# Patient Record
Sex: Female | Born: 1995 | Hispanic: No | Marital: Single | Smoking: Never smoker
Health system: Southern US, Community
[De-identification: ages and names within clinical notes are randomized; demographics above are authoritative.]

---

## 2018-10-02 ENCOUNTER — Ambulatory Visit (INDEPENDENT_AMBULATORY_CARE_PROVIDER_SITE_OTHER): Payer: Self-pay | Admitting: Sports Medicine

## 2018-10-02 VITALS — BP 112/74 | Ht 65.0 in | Wt 130.0 lb

## 2018-10-02 DIAGNOSIS — S86899A Other injury of other muscle(s) and tendon(s) at lower leg level, unspecified leg, initial encounter: Secondary | ICD-10-CM

## 2018-10-02 NOTE — Patient Instructions (Signed)
  Good to see you again today.  We have placed some arch support in her running shoes but I do not want you to run for the next 2 weeks.  You may try some light jogging afterwards if pain-free but if you still have pain with running then you will have to cross train (bike or swim) until I see you again here in the office in 4 weeks.  Once you are pain-free, you will need to practice some line drills at the local track to help correct your running form.  I would encourage you to continue with your exercises and ice 2-3 times a day.  I do not feel like we need any imaging but if you are still having pain in 4 weeks I may want to get an x-ray at that time.  Feel free to call with any questions.

## 2018-10-02 NOTE — Progress Notes (Signed)
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  She states the pain started around 2 weeks ago.  She states she started exercising with the [employer] team around 1 month ago.  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  She states she took years off, and recently returned activity.  She states when she returned to run for [employer], she started that to 400 with high intensity training.  She states the pain started around 2 weeks after starting his training.  She was seen in the training room for a physical, and was given heel walks and toe walks.  She states she is noticed some improvement since starting these.  She also switched to a [employer] shoe, which has helped somewhat with the pain.  She is still having almost instant pain in her shins but she does any jogging or running.  She denies any numbness and tingling in her foot.  She denies any trauma to the area.  She denies any history of stress fractures.  She denies any [place] inciting event.  Past Injuries: None Past Surgeries: None Smoking: Denies Family Hx: Noncontributory  ROS: Per HPI; in addition no fever, no rash, no additional weakness, no additional numbness, no additional paresthesias, and no additional falls/injury.   All past medical history is, medication, and allergies reviewed myself at today's visit.  Objective: BP 112/74   Ht 5\' 5"  (1.651 m)   Wt 130 lb (59 kg)   BMI 21.63 kg/m  Gen: Right-Hand Dominant. NAD, well groomed, a/o x3, normal affect.  CV: Well-perfused. Warm.  Resp: Non-labored.  Neuro: Sensation intact throughout. No gross coordination deficits.  Gait: Nonpathologic posture, stride remarkable for genu valgus, pronation of both feet, and out toeing on the right foot.  Bilateral foot exam: No erythema, warmth or swelling noted.  No tenderness palpation on exam.  Normal transverse and longitudinal  arches.  Full range of motion dorsiflexion, plantarflexion, eversion, inversion.  Strength out of 5 throughout testing.  Negative anterior drawer bilaterally.  Bilateral leg exam: No erythema, warmth, swelling noted.  Tenderness palpation over the anterior tibia on bilateral legs.  Full range of motion dorsiflexion, plantarflexion, eversion, inversion.  Some pain with eversion of the foot.  Strength out of 5 throughout testing.  Negative hop test.  Assessment and Plan: 1. Shinsplints, likely secondary to sudden onset of high intensity training. 2.  Pronation of gait.  We discussed treatment options today.  I believe she will benefit greatly from sitting out 2 weeks from running.  This includes any running or jumping.  She can exercise otherwise if she gets no pain.  We gave her some green insoles today with scaphoid pads to help with the pronation of her foot.  Also advised to start doing line drills to work on the out toeing on her right foot.  She should continue with the heel walks and toe walks for the shinsplints.  We will see her back for follow-up in 1 month.  If she continues to have pain that persist, I would consider getting x-rays to further evaluate.  Alric Quan, MD [facility] Fellow 10/02/2018 5:19 PM   Patient seen and evaluated with the fellow.  I agree with the above plan of care.  Treatment as above and follow-up with me in 4 weeks.  If she continues to have symptoms at follow-up, consider merits of  x-ray specifically to rule out stress fracture.

## 2018-10-03 ENCOUNTER — Encounter: Payer: Self-pay | Admitting: Sports Medicine

## 2020-02-03 ENCOUNTER — Emergency Department (HOSPITAL_COMMUNITY)
Admission: EM | Admit: 2020-02-03 | Discharge: 2020-02-03 | Disposition: A | Discharge status: Home / Self Care | Payer: BLUE CROSS/BLUE SHIELD | Attending: Emergency Medicine | Admitting: Emergency Medicine

## 2020-02-03 ENCOUNTER — Encounter (HOSPITAL_COMMUNITY): Payer: Self-pay | Admitting: *Deleted

## 2020-02-03 ENCOUNTER — Other Ambulatory Visit: Payer: Self-pay

## 2020-02-03 ENCOUNTER — Emergency Department (HOSPITAL_COMMUNITY): Payer: BLUE CROSS/BLUE SHIELD

## 2020-02-03 DIAGNOSIS — R0602 Shortness of breath: Secondary | ICD-10-CM | POA: Insufficient documentation

## 2020-02-03 DIAGNOSIS — Z5321 Procedure and treatment not carried out due to patient leaving prior to being seen by health care provider: Secondary | ICD-10-CM | POA: Insufficient documentation

## 2020-02-03 DIAGNOSIS — R0789 Other chest pain: Secondary | ICD-10-CM | POA: Insufficient documentation

## 2020-02-03 NOTE — ED Triage Notes (Signed)
The pt is c/o chest pain  Central chest and pain in her lt shoulder and sob  Not sob at present lmp  June 17th

## 2020-02-03 NOTE — ED Notes (Signed)
Pt called for vital signs x1 with no response.

## 2020-02-03 NOTE — ED Notes (Signed)
Called for pt x3, no response. 

## 2020-02-26 ENCOUNTER — Other Ambulatory Visit: Payer: Self-pay

## 2020-02-26 ENCOUNTER — Encounter (HOSPITAL_COMMUNITY): Payer: Self-pay

## 2020-02-26 DIAGNOSIS — R079 Chest pain, unspecified: Secondary | ICD-10-CM | POA: Insufficient documentation

## 2020-02-26 DIAGNOSIS — R519 Headache, unspecified: Secondary | ICD-10-CM | POA: Insufficient documentation

## 2020-02-26 DIAGNOSIS — Z5321 Procedure and treatment not carried out due to patient leaving prior to being seen by health care provider: Secondary | ICD-10-CM | POA: Diagnosis not present

## 2020-02-26 NOTE — ED Triage Notes (Signed)
Pt complains of chest pain and a headache for a week, has been seen at [facility] for the same

## 2020-02-27 ENCOUNTER — Emergency Department (HOSPITAL_COMMUNITY)
Admission: EM | Admit: 2020-02-27 | Discharge: 2020-02-27 | Disposition: A | Discharge status: Home / Self Care | Payer: BLUE CROSS/BLUE SHIELD | Attending: Emergency Medicine | Admitting: Emergency Medicine

## 2020-02-27 NOTE — ED Notes (Signed)
Pt called for a room with no answer and not seen in the lobby

## 2020-02-27 NOTE — ED Notes (Signed)
Called pt from lobby x1 No answer 

## 2020-06-04 ENCOUNTER — Other Ambulatory Visit: Payer: Self-pay

## 2020-06-04 ENCOUNTER — Encounter (HOSPITAL_COMMUNITY): Payer: Self-pay | Admitting: Emergency Medicine

## 2020-06-04 ENCOUNTER — Emergency Department (HOSPITAL_COMMUNITY)
Admission: EM | Admit: 2020-06-04 | Discharge: 2020-06-04 | Disposition: A | Discharge status: Home / Self Care | Payer: BLUE CROSS/BLUE SHIELD | Attending: Emergency Medicine | Admitting: Emergency Medicine

## 2020-06-04 ENCOUNTER — Emergency Department (HOSPITAL_COMMUNITY): Payer: BLUE CROSS/BLUE SHIELD

## 2020-06-04 DIAGNOSIS — R197 Diarrhea, unspecified: Secondary | ICD-10-CM | POA: Diagnosis not present

## 2020-06-04 DIAGNOSIS — R0602 Shortness of breath: Secondary | ICD-10-CM | POA: Diagnosis not present

## 2020-06-04 DIAGNOSIS — N898 Other specified noninflammatory disorders of vagina: Secondary | ICD-10-CM | POA: Insufficient documentation

## 2020-06-04 DIAGNOSIS — R0789 Other chest pain: Secondary | ICD-10-CM

## 2020-06-04 DIAGNOSIS — R072 Precordial pain: Secondary | ICD-10-CM | POA: Insufficient documentation

## 2020-06-04 DIAGNOSIS — R1030 Lower abdominal pain, unspecified: Secondary | ICD-10-CM | POA: Diagnosis present

## 2020-06-04 LAB — CBC
HCT: 39.5 % (ref 36.0–46.0)
Hemoglobin: 13.1 g/dL (ref 12.0–15.0)
MCH: 29.8 pg (ref 26.0–34.0)
MCHC: 33.2 g/dL (ref 30.0–36.0)
MCV: 89.8 fL (ref 80.0–100.0)
Platelets: 235 10*3/uL (ref 150–400)
RBC: 4.4 MIL/uL (ref 3.87–5.11)
RDW: 12.9 % (ref 11.5–15.5)
WBC: 4.4 10*3/uL (ref 4.0–10.5)
nRBC: 0 % (ref 0.0–0.2)

## 2020-06-04 LAB — COMPREHENSIVE METABOLIC PANEL
ALT: 16 U/L (ref 0–44)
AST: 17 U/L (ref 15–41)
Albumin: 4.8 g/dL (ref 3.5–5.0)
Alkaline Phosphatase: 49 U/L (ref 38–126)
Anion gap: 8 (ref 5–15)
BUN: 16 mg/dL (ref 6–20)
CO2: 27 mmol/L (ref 22–32)
Calcium: 9.9 mg/dL (ref 8.9–10.3)
Chloride: 100 mmol/L (ref 98–111)
Creatinine, Ser: 0.86 mg/dL (ref 0.44–1.00)
GFR, Estimated: >60 mL/min (ref >60)
Glucose, Bld: 98 mg/dL (ref 70–99)
Potassium: 3.9 mmol/L (ref 3.5–5.1)
Sodium: 135 mmol/L (ref 135–145)
Total Bilirubin: 1 mg/dL (ref 0.3–1.2)
Total Protein: 8.9 g/dL — ABNORMAL HIGH (ref 6.5–8.1)

## 2020-06-04 LAB — URINALYSIS, ROUTINE W REFLEX MICROSCOPIC
Bilirubin Urine: NEGATIVE
Glucose, UA: NEGATIVE mg/dL
Hgb urine dipstick: NEGATIVE
Ketones, ur: 20 mg/dL — AB
Nitrite: NEGATIVE
Protein, ur: NEGATIVE mg/dL
Specific Gravity, Urine: 1.023 (ref 1.005–1.030)
pH: 7 (ref 5.0–8.0)

## 2020-06-04 LAB — LIPASE, BLOOD: Lipase: 25 U/L (ref 11–51)

## 2020-06-04 LAB — I-STAT BETA HCG BLOOD, ED (MC, WL, AP ONLY): I-stat hCG, quantitative: <5 m[IU]/mL (ref <5)

## 2020-06-04 NOTE — Discharge Instructions (Signed)
Please read and follow all provided instructions.  Your diagnoses today include:  1. Sternal pain   2. Lower abdominal pain     Tests performed today include:  Blood cell counts and platelets  Kidney and liver function tests  Pancreas function test (called lipase)  Urine test to look for infection -some infection fighting cells but unclear if there is infection in your urine  A blood or urine test for pregnancy (women only)  Chest x-ray -no signs of problems  Vital signs. See below for your results today.   Medications prescribed:  Please use over-the-counter NSAID medications (ibuprofen, naproxen) as directed on the packaging for pain.   Take any prescribed medications only as directed.  Home care instructions:   Follow any educational materials contained in this packet.  Follow-up instructions: Please follow-up with the GYN referral for further evaluation of your symptoms.    Return instructions:  SEEK IMMEDIATE MEDICAL ATTENTION IF:  The pain does not go away or becomes severe   A temperature above 101F develops   Repeated vomiting occurs (multiple episodes)   The pain becomes localized to portions of the abdomen. The right side could possibly be appendicitis. In an adult, the left lower portion of the abdomen could be colitis or diverticulitis.   Blood is being passed in stools or vomit (bright red or black tarry stools)   You develop chest pain, difficulty breathing, dizziness or fainting, or become confused, poorly responsive, or inconsolable (young children)  If you have any other emergent concerns regarding your health  Additional Information: Abdominal (belly) pain can be caused by many things. Your caregiver performed an examination and possibly ordered blood/urine tests and imaging (CT scan, x-rays, ultrasound). Many cases can be observed and treated at home after initial evaluation in the emergency department. Even though you are being discharged home,  abdominal pain can be unpredictable. Therefore, you need a repeated exam if your pain does not resolve, returns, or worsens. Most patients with abdominal pain don't have to be admitted to the hospital or have surgery, but serious problems like appendicitis and gallbladder attacks can start out as nonspecific pain. Many abdominal conditions cannot be diagnosed in one visit, so follow-up evaluations are very important.  Your vital signs today were: BP 111/83   Pulse 87   Temp 98.7 F (37.1 C) (Oral)   Resp 18   LMP 05/20/2020   SpO2 99%  If your blood pressure (bp) was elevated above 135/85 this visit, please have this repeated by your doctor within one month. --------------

## 2020-06-04 NOTE — ED Triage Notes (Signed)
Pt reports had lower abd pains that radiates down both legs since the weekend and thought was constipated so took a laxative and had loose stools since. Pt also c/o central chest tightness and SOB when getting up in the mornings since Sunday.

## 2020-06-04 NOTE — ED Provider Notes (Signed)
[facility]-EMERGENCY DEPT Provider Note   CSN: [id number] Arrival date & time: 06/04/20  0762     History Chief Complaint  Patient presents with  . Abdominal Pain  . Chest Pain    Veronica English is a 24 y.o. female.  Patient presents to the emergency department with several complaints.  Patient states that 5 days ago she began having lower abdominal cramping and pain that radiated into her legs.  She was seen at urgent care and was prescribed a laxative for presumptive constipation.  She reports having some loose stools since that time without blood.  The following day she also developed an aching pain in her central chest with occasional shortness of breath that is worse in the mornings.  No associated fevers or cough.  No URI symptoms.  Patient reports being provided prescription for metronidazole gel for treatment of BV as she currently has vaginal discharge.  She is sexually active, not concerned for sexually transmitted infection.  No history of abdominal surgeries. Patient denies risk factors for pulmonary embolism including: unilateral leg swelling, history of DVT/PE/other blood clots, use of exogenous hormones, recent immobilizations, recent surgery, recent travel (>4hr segment), malignancy, hemoptysis. No hematuria or irritative UTI symptoms including dysuria, increased frequency or urgency.         History reviewed. No pertinent past medical history.  There are no problems to display for this patient.   History reviewed. No pertinent surgical history.   OB History   No obstetric history on file.     No family history on file.  Social History   Tobacco Use  . Smoking status: Never Smoker  . Smokeless tobacco: Never Used  Substance Use Topics  . Alcohol use: Yes  . Drug use: Never    Home Medications Prior to Admission medications   Not on File    Allergies    Patient has no known allergies.  Review of Systems   Review of Systems    Constitutional: Positive for appetite change. Negative for fever.  HENT: Negative for rhinorrhea and sore throat.   Eyes: Negative for redness.  Respiratory: Positive for shortness of breath. Negative for cough.   Cardiovascular: Positive for chest pain.  Gastrointestinal: Positive for abdominal pain. Negative for diarrhea, nausea and vomiting.  Genitourinary: Positive for vaginal discharge. Negative for dysuria, frequency, hematuria, urgency and vaginal bleeding.  Musculoskeletal: Negative for myalgias.  Skin: Negative for rash.  Neurological: Negative for headaches.    Physical Exam Updated Vital Signs BP 116/64   Pulse (!) 104   Temp 98.7 F (37.1 C) (Oral)   Resp (!) 22   LMP 05/20/2020   SpO2 98%   Physical Exam Vitals and nursing note reviewed.  Constitutional:      General: She is not in acute distress.    Appearance: She is well-developed.  HENT:     Head: Normocephalic and atraumatic.     Right Ear: External ear normal.     Left Ear: External ear normal.     Nose: Nose normal.  Eyes:     Conjunctiva/sclera: Conjunctivae normal.  Cardiovascular:     Rate and Rhythm: Normal rate and regular rhythm.     Heart sounds: No murmur heard.   Pulmonary:     Effort: No respiratory distress.     Breath sounds: No wheezing, rhonchi or rales.  Abdominal:     Palpations: Abdomen is soft.     Tenderness: There is no abdominal tenderness. There is no  guarding or rebound. Negative signs include Murphy's sign and McBurney's sign.  Musculoskeletal:     Cervical back: Normal range of motion and neck supple.     Right lower leg: No edema.     Left lower leg: No edema.  Skin:    General: Skin is warm and dry.     Findings: No rash.  Neurological:     General: No focal deficit present.     Mental Status: She is alert. Mental status is at baseline.     Motor: No weakness.  Psychiatric:        Mood and Affect: Mood normal.     ED Results / Procedures / Treatments    Labs (all labs ordered are listed, but only abnormal results are displayed) Labs Reviewed  COMPREHENSIVE METABOLIC PANEL - Abnormal; Notable for the following components:      Result Value   Total Protein 8.9 (*)    All other components within normal limits  URINALYSIS, ROUTINE W REFLEX MICROSCOPIC - Abnormal; Notable for the following components:   Ketones, ur 20 (*)    Leukocytes,Ua SMALL (*)    Bacteria, UA RARE (*)    All other components within normal limits  URINE CULTURE  LIPASE, BLOOD  CBC  I-STAT BETA HCG BLOOD, ED ([facility], [facility], [facility] ONLY)    EKG EKG Interpretation  Date/Time:  Thursday June 04 2020 09:59:55 EDT Ventricular Rate:  102 PR Interval:    QRS Duration: 70 QT Interval:  324 QTC Calculation: 422 R Axis:   90 Text Interpretation: Sinus tachycardia Borderline right axis deviation Borderline T abnormalities, anterior leads rate faster since last 7/21 Confirmed by Meridee Score [phone]) on 06/04/2020 10:22:49 AM   Radiology DG Chest 2 View  Result Date: 06/04/2020 CLINICAL DATA:  Chest pain EXAM: CHEST - 2 VIEW COMPARISON:  02/03/2020 FINDINGS: The heart size and mediastinal contours are within normal limits. Both lungs are clear. No pleural effusion or pneumothorax. The visualized skeletal structures are unremarkable. IMPRESSION: No acute process in the chest. Electronically Signed   By: Guadlupe Spanish M.D.   On: 06/04/2020 12:03    Procedures Procedures (including critical care time)  Medications Ordered in ED Medications - No data to display  ED Course  I have reviewed the triage vital signs and the nursing notes.  Pertinent labs & imaging results that were available during my care of the patient were reviewed by me and considered in my medical decision making (see chart for details).  Patient seen and examined.  Reviewed work-up to this point with patient at bedside.  Discussed that her CBC, CMP, lipase are all very reassuring at this point.   Discussed that I would like to obtain a UA as well as chest x-ray.  EKG reviewed morphology similar to July 2021, however faster today.  Patient does not have any significant history features or signs suggestive of PE today.  She is not hypoxic.  She has no respiratory distress.  Vital signs reviewed and are as follows: BP 116/64   Pulse (!) 104   Temp 98.7 F (37.1 C) (Oral)   Resp (!) 22   LMP 05/20/2020   SpO2 98%   We did discuss the possibility of vaginal infection given discharge and lower abdominal pain.  Discussed that bacterial vaginosis typically does not cause lower abdominal or pelvic pain.  Discussed that her symptoms may eventually warrant pelvic exam.  I offered to do this here today.  She asked if there  would be a female provider who would be able to do the exam and currently there is not one available.  She states that she would prefer to wait and see her primary care doctor regarding this.  We did discuss other possible infections including pelvic inflammatory disease which cause similar symptoms.  Awaiting remainder of work-up.  1:11 PM patient informed of work-up results, which are largely reassuring.  Question of some white blood cells in UA, culture sent.  Patient be called if it appears she has a UTI.  Encourage use of NSAIDs and Tylenol as needed for pain.  She requests OB/GYN referral prior to discharge and I have sent her a referral to the OB/GYN clinic.  The patient was urged to return to the Emergency Department immediately with worsening of current symptoms, worsening abdominal pain, persistent vomiting, blood noted in stools, fever, or any other concerns. The patient verbalized understanding.     MDM Rules/Calculators/A&P                          CP: Patient without concerning EKG findings.  Very low suspicion for acute coronary syndrome given age and risk factor profile.  Pain is worse in the mornings which is unusual.  She does not have ataxia.  EKG performed here  shows tachycardia but no ischemic changes.  Patient does not have any significant risk factors for PE and no significant shortness of breath.  Do not feel that she requires further work-up for this at this time do not feel that she requires troponin testing.  Possibly chest wall or GI pain.  Encourage PCP follow-up.  Abdominal pain: Patient with abdominal pain and vaginal d/c. Has not yet picked up prescription for metronidazole but seems like it does not appear treatment.  Offered pelvic exam today and patient declines.  Otherwise, vitals are stable, no fever. Labs are essentially normal, patient not pregnant. Imaging not felt indicated.. No signs of dehydration, patient is tolerating PO's. Lungs are clear and no signs suggestive of PNA. Low concern for appendicitis, cholecystitis, pancreatitis, ruptured viscus, UTI, kidney stone, aortic dissection, aortic aneurysm or other emergent abdominal etiology. Supportive therapy indicated with return if symptoms worsen.   No dangerous or life-threatening conditions suspected or identified by history, physical exam, and by work-up. No indications for hospitalization identified.    Final Clinical Impression(s) / ED Diagnoses Final diagnoses:  Sternal pain  Lower abdominal pain    Rx / DC Orders ED Discharge Orders    None       Renne Crigler, PA-C 06/04/20 1315    Terrilee Files, MD 06/05/20 1029

## 2020-06-05 LAB — URINE CULTURE: Culture: <10000 — AB

## 2021-07-20 IMAGING — CR DG CHEST 2V
2 series · 2 of 2 positions shown · non-contrast
Comparison: None.

CLINICAL DATA: Chest pain for several hours

EXAM:
CHEST - 2 VIEW

[chest pa]
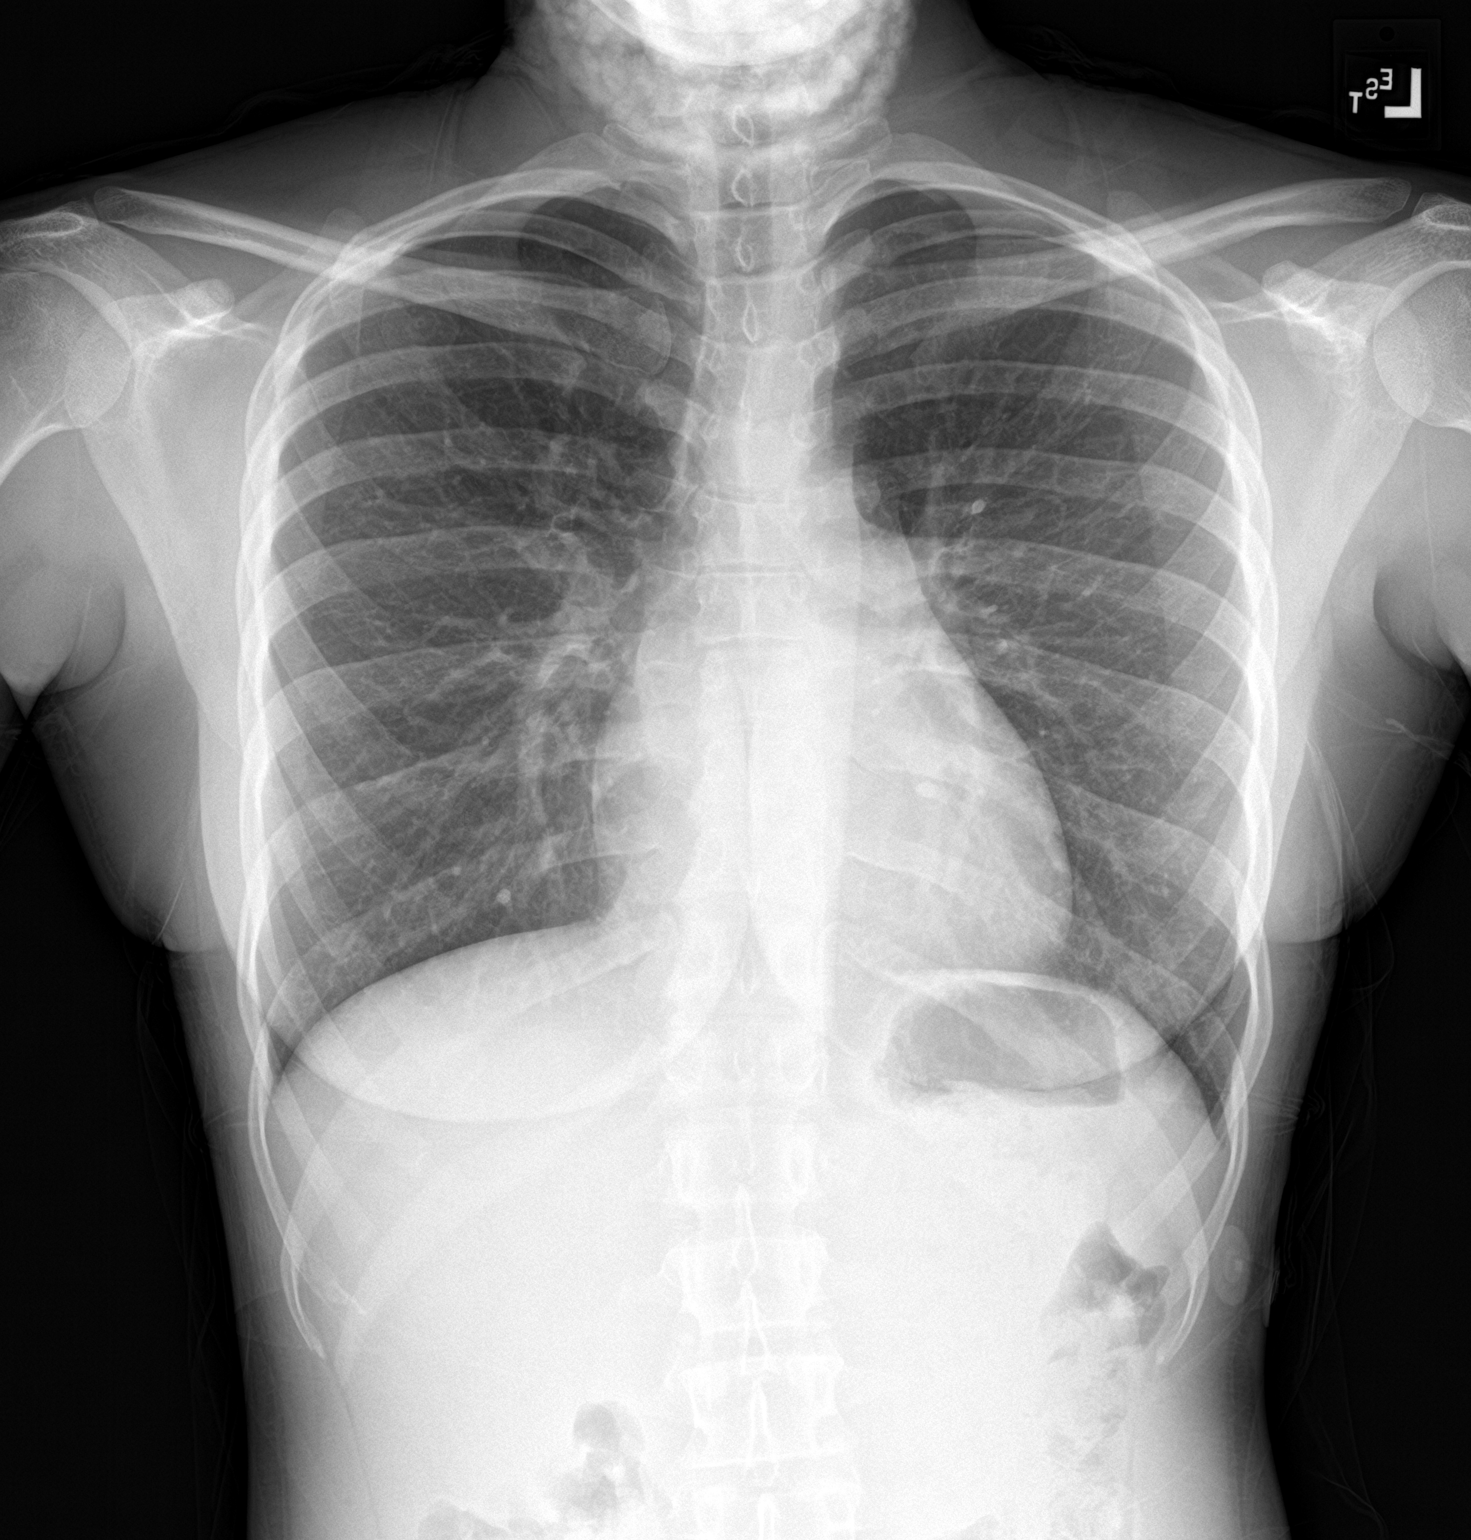

[chest lat]
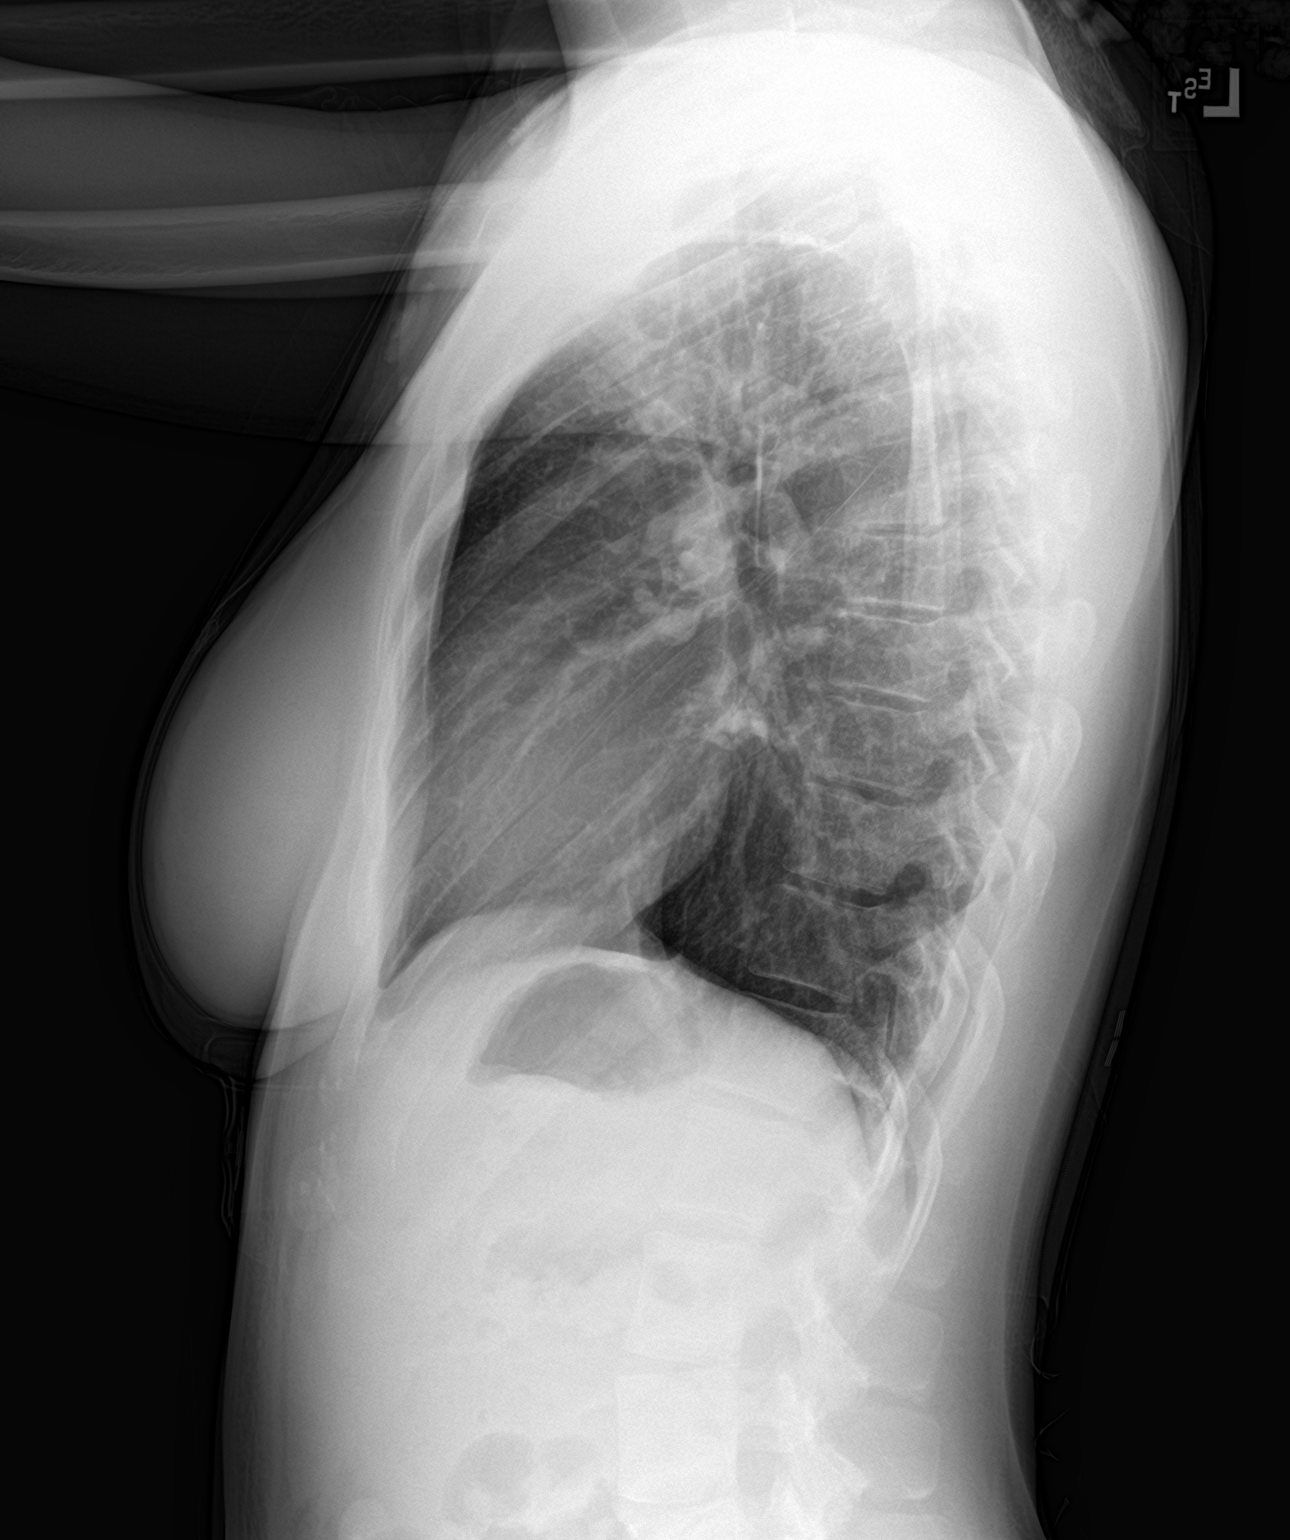

[2 of 2 positions shown; findings below may reference images not displayed]

FINDINGS: The heart size and mediastinal contours are within normal limits.
Both lungs are clear. The visualized skeletal structures are
unremarkable.
IMPRESSION: No active cardiopulmonary disease.

## 2021-11-19 IMAGING — CR DG CHEST 2V
2 series · 2 of 2 positions shown · non-contrast
Comparison: 02/03/2020

CLINICAL DATA: Chest pain

EXAM:
CHEST - 2 VIEW

[w chest pa]
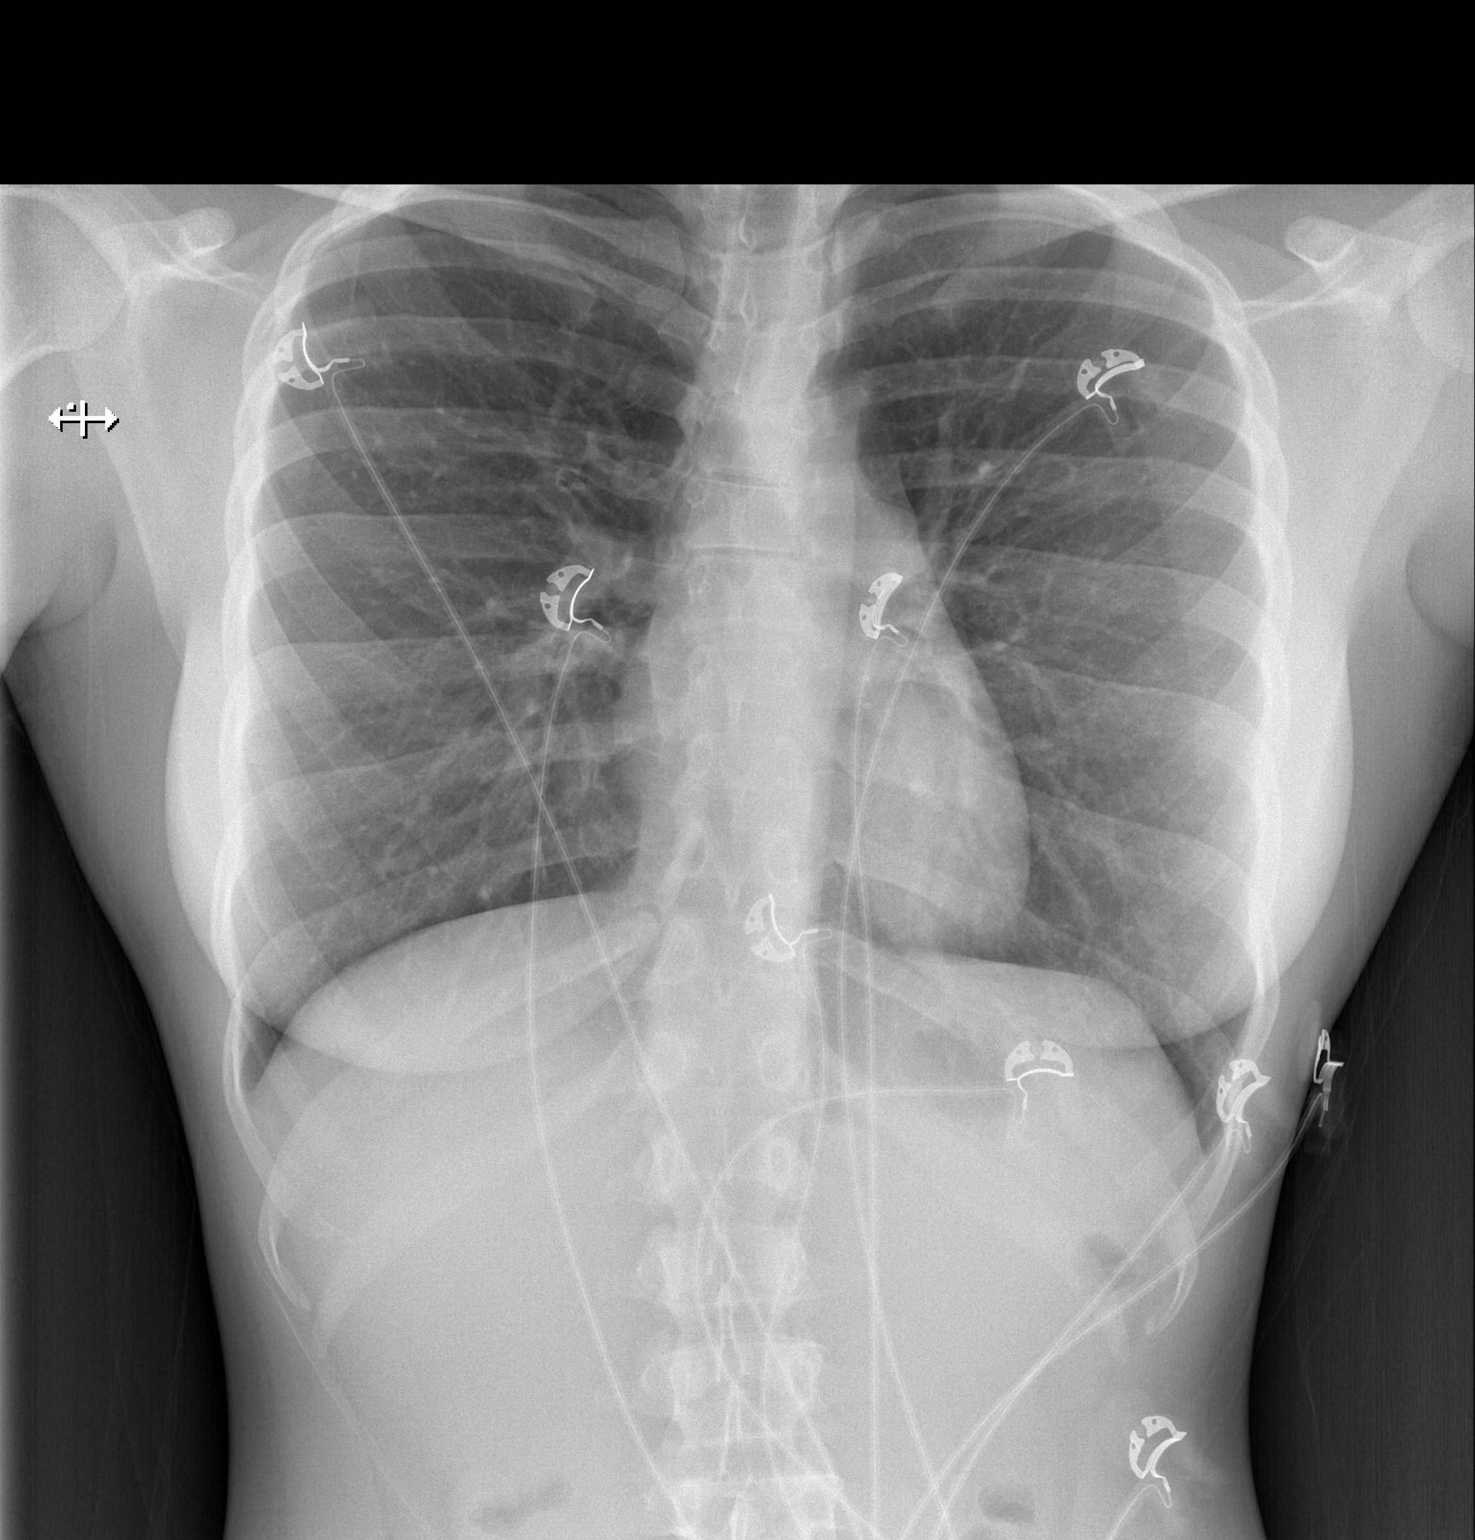

[w chest lat]
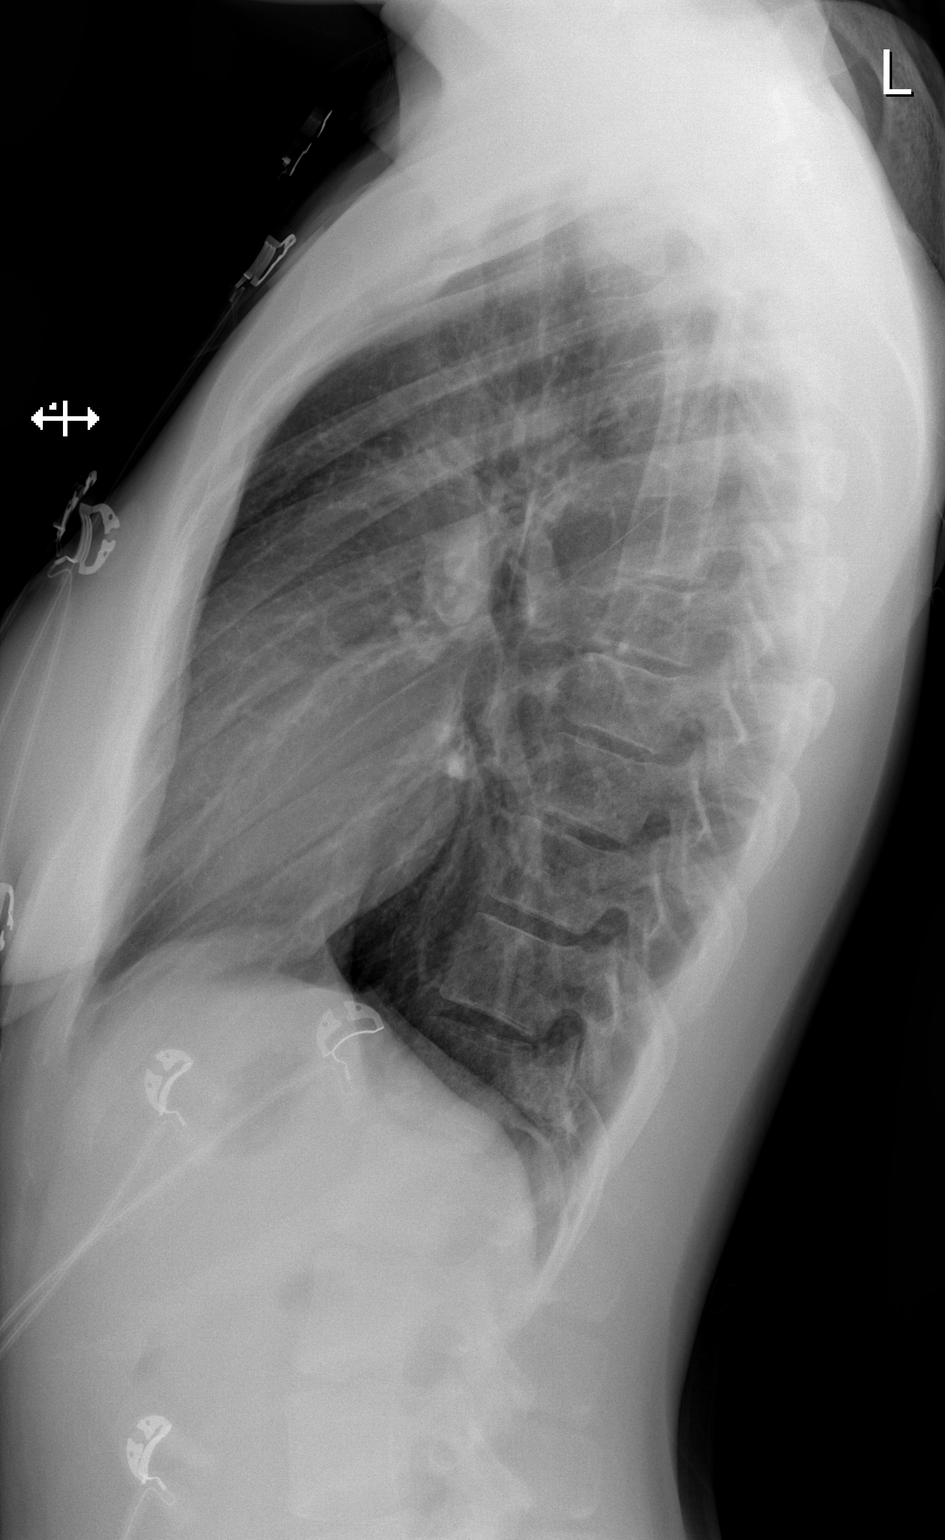

[2 of 2 positions shown; findings below may reference images not displayed]

FINDINGS: The heart size and mediastinal contours are within normal limits.
Both lungs are clear. No pleural effusion or pneumothorax. The
visualized skeletal structures are unremarkable.
IMPRESSION: No acute process in the chest.
